# Patient Record
Sex: Female | Born: 2000 | Race: White | Hispanic: Yes | Marital: Single | Smoking: Never smoker
Health system: Southern US, Community
[De-identification: ages and names within clinical notes are randomized; demographics above are authoritative.]

---
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
## 2021-09-13 IMAGING — DX DG KNEE COMPLETE 4+V*L*
4 series · 4 of 4 positions shown · non-contrast
Comparison: None.

CLINICAL DATA: MEDIAL LEFT knee pain after injury.  Rugby injury.

EXAM:
LEFT KNEE - COMPLETE 4+ VIEW

[knee ap]
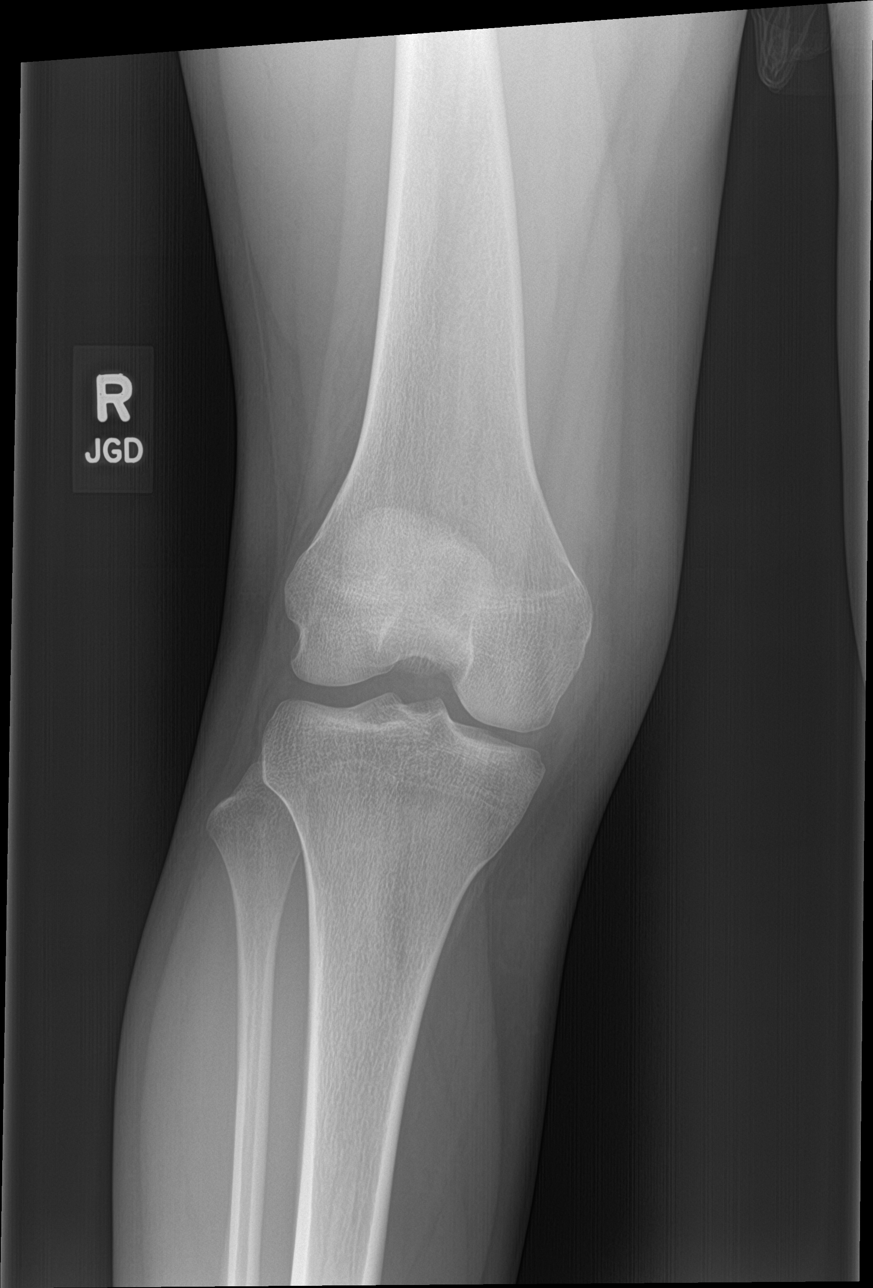

[knee obl]
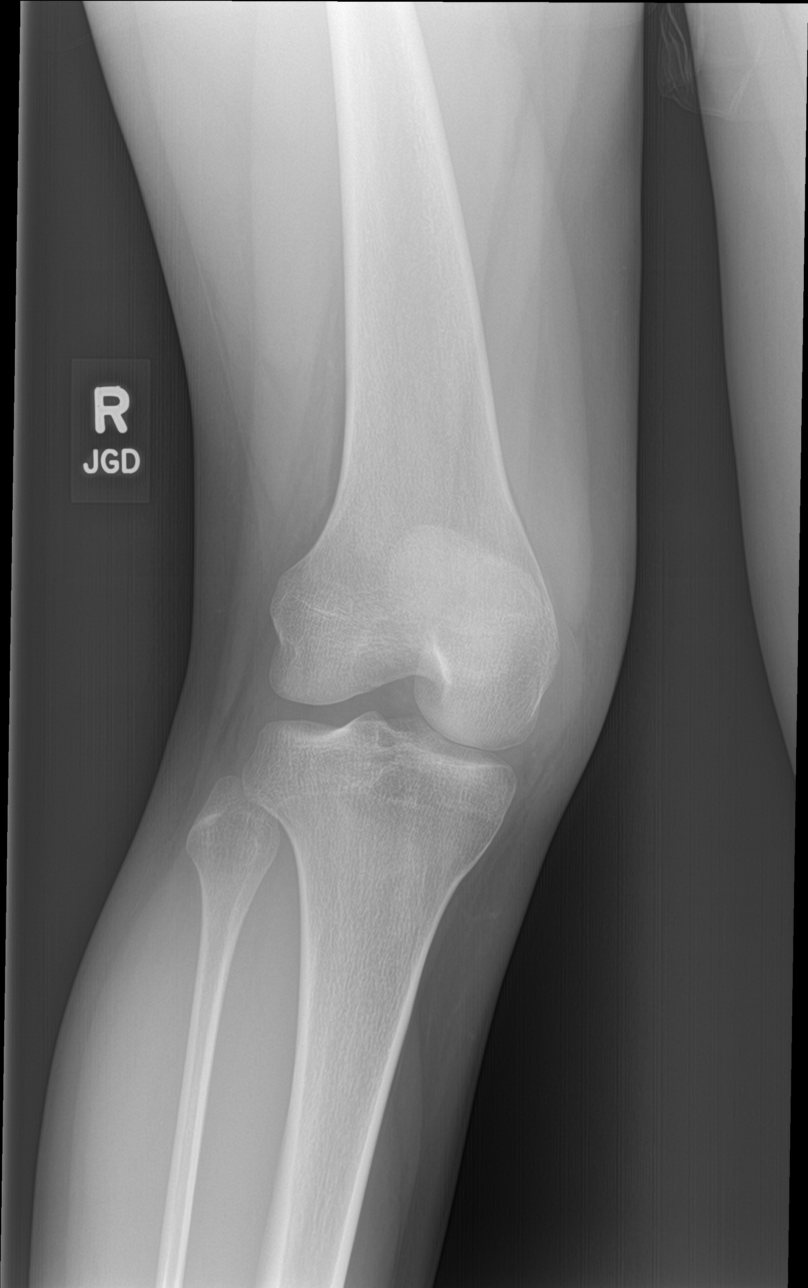

[knee lat]
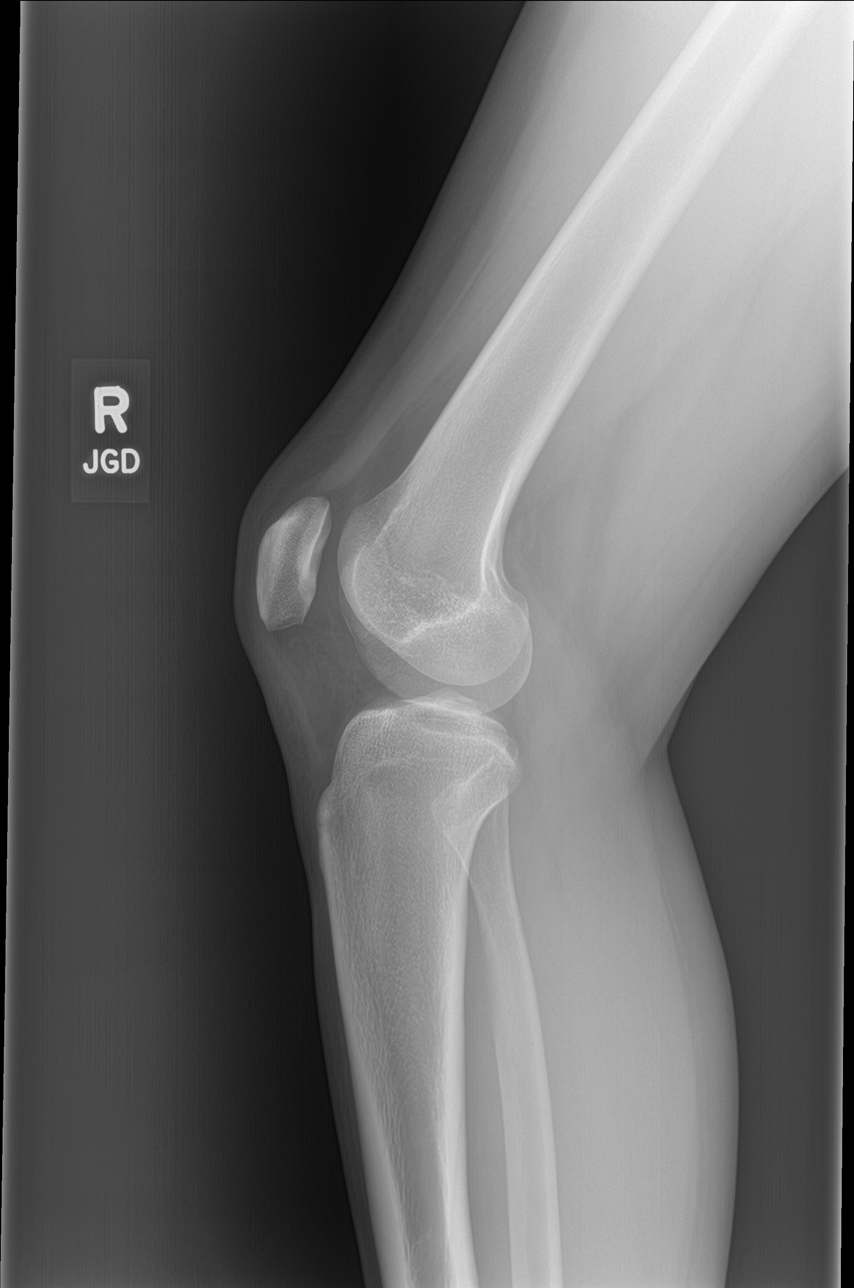

[knee sunrise]
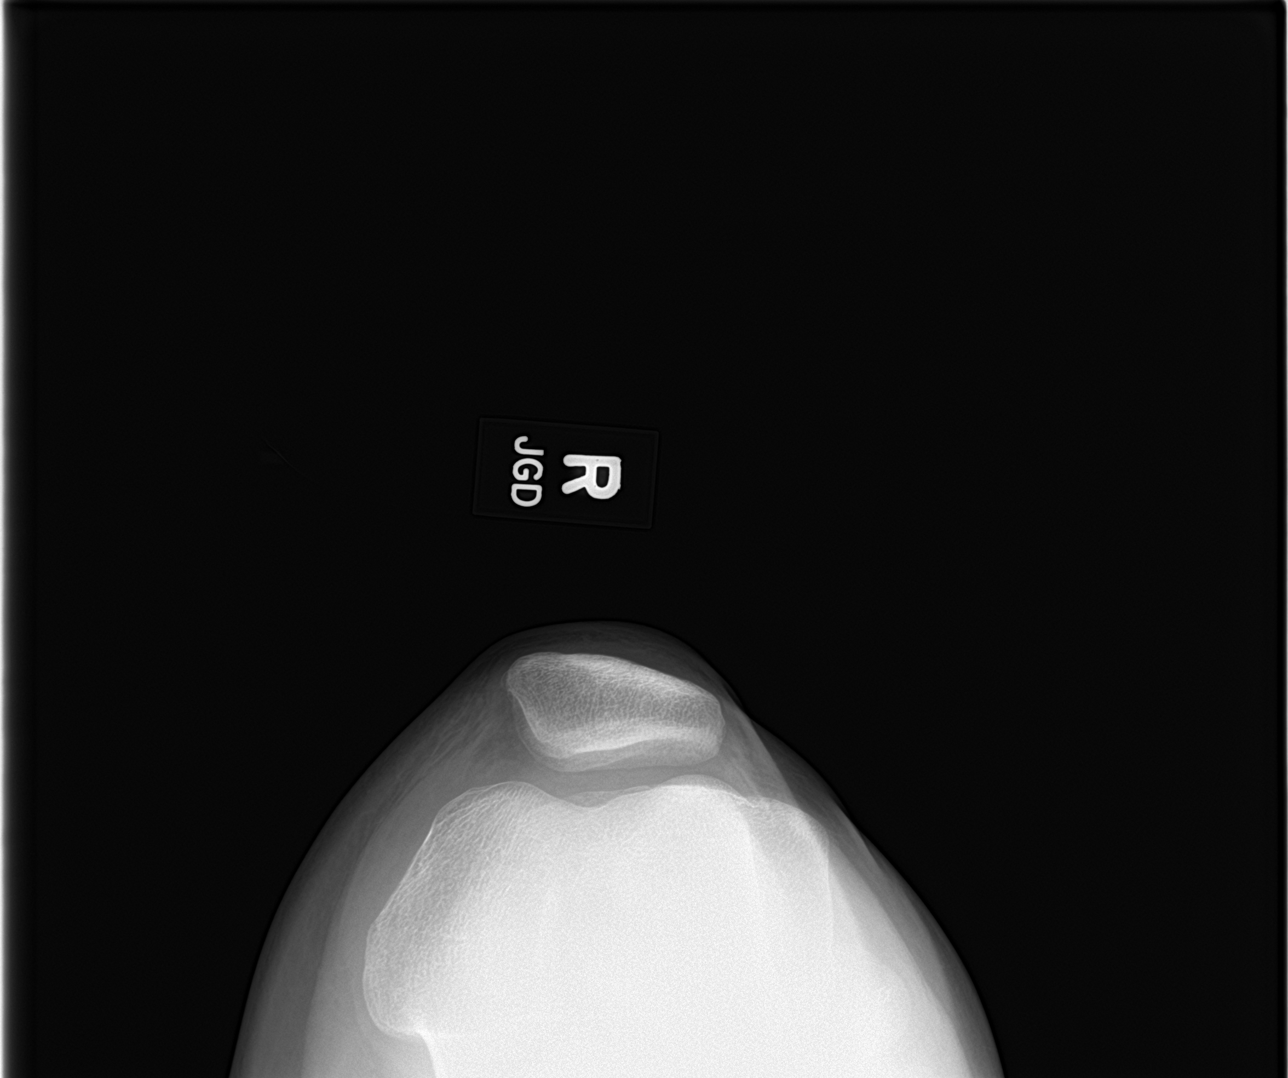

[4 of 4 positions shown; findings below may reference images not displayed]

FINDINGS: No evidence of fracture, dislocation, or joint effusion. No evidence
of arthropathy or other focal bone abnormality. Soft tissues are
unremarkable.
IMPRESSION: Negative.

## 2021-12-01 DIAGNOSIS — Z724 Inappropriate diet and eating habits: Secondary | ICD-10-CM | POA: Diagnosis not present

## 2021-12-06 DIAGNOSIS — F332 Major depressive disorder, recurrent severe without psychotic features: Secondary | ICD-10-CM | POA: Diagnosis not present

## 2021-12-15 DIAGNOSIS — F332 Major depressive disorder, recurrent severe without psychotic features: Secondary | ICD-10-CM | POA: Diagnosis not present

## 2021-12-25 DIAGNOSIS — F339 Major depressive disorder, recurrent, unspecified: Secondary | ICD-10-CM | POA: Diagnosis not present

## 2022-01-01 ENCOUNTER — Encounter: Payer: Self-pay | Admitting: Family Medicine

## 2022-01-01 ENCOUNTER — Ambulatory Visit: Payer: BC Managed Care – PPO | Admitting: Family Medicine

## 2022-01-01 DIAGNOSIS — F339 Major depressive disorder, recurrent, unspecified: Secondary | ICD-10-CM | POA: Diagnosis not present

## 2022-01-01 MED ORDER — FLUOXETINE HCL 20 MG PO TABS: 20.0000 mg | ORAL_TABLET | Freq: Every day | ORAL | 3 refills | Status: DC

## 2022-01-01 NOTE — Patient Instructions (Signed)
Call me in 3-4 weeks.  If you sign up for MyChart, it is easy to send me a MyChart message.  I am starting you on the lower dose of the prozac/fluoxetine.  We can decide in 3-4 weeks if you want to go to the higher dose.   I will have Dr. Hartford Poli call you to set up an appointment.  She can help you find a more long term counselor. You can also look at the psychology today website.   Occasionally, people who start on antidepressants feel worse before they feel better.  Generally, that happens in the first week or two.  If you start feeling worse, see me right away.

## 2022-01-01 NOTE — Assessment & Plan Note (Addendum)
Restart fluoxetine at lower dose (was on 40 mg previously)  FU by me in 3 weeks. She thinks she will benefit from long term counseling.  She will start with dr. Shawnee Knapp and at their discretion, will transition to a more long term counselor.   Duration of visit was 30 minutes.

## 2022-01-01 NOTE — Progress Notes (Signed)
    SUBJECTIVE:   CHIEF COMPLAINT / HPI:   FU depression:  Virginia Perkins was seen regularly by me and Dr. Shawnee Perkins during [employer] senior year of high school for depression.  Per Virginia Perkins, she improved enough to attend [employer] as planned.  I had suggested that she arrange ongoing counseling through the school.  Per Virginia Perkins, that did not happen although she was vague about the reasons.   She muddled through her freshman and sophomore years with difficulty.  "It was hard."  Again, very sparing on the details.  She is not sure she wants to return. PHQ9 scored a 2 on Question 9.  She has no plan.  Has thoughts that she would be better off if not here.  Those thoughts have increased with her worsening symptoms.   It has been quite some time since she has been on prozac.  She does recall the meds helping previously.  She also found counseling helpful.       OBJECTIVE:   BP 103/69   Pulse 78   Ht 5\' 9"  (1.753 m)   Wt 144 lb 3.2 oz (65.4 kg)   SpO2 100%   BMI 21.29 kg/m   Good eye contact throughout   Became quite tearful at one point.  I suspect that I touched a nerve, but I am not sure.  Despite my gentle prodding, she would not elaborate.  Which is fine since she will be getting counseling.  If she will only discuss details with one of , it is more important for her to open up with her counselor.  ASSESSMENT/PLAN:   Major depression, recurrent (HCC) Restart fluoxetine at lower dose (was on 40 mg previously)  FU by me in 3 weeks. She thinks she will benefit from long term counseling.  She will start with dr. [place] and at their discretion, will transition to a more long term counselor.       Virginia Knapp, MD [facility]

## 2022-01-02 ENCOUNTER — Telehealth: Payer: Self-pay | Admitting: Psychology

## 2022-01-02 NOTE — Telephone Encounter (Signed)
Called and lvm about getting therapist set up or seeing me.   Royetta Asal, PhD., LMFT

## 2022-01-18 DIAGNOSIS — R059 Cough, unspecified: Secondary | ICD-10-CM | POA: Diagnosis not present

## 2022-01-18 DIAGNOSIS — Z20822 Contact with and (suspected) exposure to covid-19: Secondary | ICD-10-CM | POA: Diagnosis not present

## 2022-01-18 DIAGNOSIS — R067 Sneezing: Secondary | ICD-10-CM | POA: Diagnosis not present

## 2022-01-18 DIAGNOSIS — R0982 Postnasal drip: Secondary | ICD-10-CM | POA: Diagnosis not present

## 2022-09-18 ENCOUNTER — Encounter: Payer: Self-pay | Admitting: Family Medicine

## 2023-01-29 ENCOUNTER — Ambulatory Visit (INDEPENDENT_AMBULATORY_CARE_PROVIDER_SITE_OTHER): Payer: Self-pay | Admitting: Family Medicine

## 2023-01-29 ENCOUNTER — Encounter: Payer: Self-pay | Admitting: Family Medicine

## 2023-01-29 ENCOUNTER — Other Ambulatory Visit: Payer: Self-pay

## 2023-01-29 VITALS — BP 110/78 | HR 98 | Ht 69.0 in | Wt 161.4 lb

## 2023-01-29 DIAGNOSIS — F339 Major depressive disorder, recurrent, unspecified: Secondary | ICD-10-CM

## 2023-01-29 MED ORDER — FLUOXETINE HCL 20 MG PO TABS: 20.0000 mg | ORAL_TABLET | Freq: Every day | ORAL | 2 refills | Status: AC

## 2023-01-29 NOTE — Patient Instructions (Addendum)
It was nice seeing you today!  I have refilled your Prozac medication.  I am referring you to a social worker to help you get connected with a therapist. You can try calling one of the numbers below if you desire.  Stay well, Virginia Deeds, MD [facility] [phone]  --  Make sure to check out at the front desk before you leave today.  Please arrive at least 15 minutes prior to your scheduled appointments.  If you had blood work today, I will send you a MyChart message or a letter if results are normal. Otherwise, I will give you a call.  If you had a referral placed, they will call you to set up an appointment. Please give [place] a call if you don't hear back in the next 2 weeks.  If you need additional refills before your next appointment, please call your pharmacy first.  --  Therapy and Counseling Resources Most providers on this list will take Medicaid. Patients with commercial insurance or Medicare should contact their insurance company to get a list of in network providers.  [facility] (takes children) Location 1: [address] [place], [place] [address] Location 2: [address] [place], [place] [address] [phone]   [facility] (spanish speaking therapist available)(habla espanol)(take medicare and medicaid)  [address] [place] [address], [place], [place] [address], [email or web address] [phone]  [facility] Counseling [address] [place], [place] [address] [phone]  [facility]   [address] Suite [place], [place] [address]      [phone]  [facility] (spanish available) [address]  [place], [place] [address] [phone]  [facility] (take [facility] and medicare) [address]  [place], [place] [address]       [phone]     [facility] (virtual only) [phone]  [facility]ovita [facility]ussma[facility] [address]  [address], [place], [place] [phone]  [facility]:  [address] [place] [phone]  [facility]:  [address] [place] [place]ien[place] [phone]  [facility] (under & uninsured) [address]   [place] [phone]    [email or web address]    [facility] [address]  [place]    [phone]  [facility] -[address]     Phone:  [phone]     [facility]-  [address]  [phone]   [facility] [address]      [place], [place] [phone]  [facility]: [address] [place], [place], [place]  [phone]   [facility] (Spanish therapist) [email or web address]  [address] [place]  [address]   [place] [address]    [phone]    [facility]   [address],  [place], [place]  [phone]   [facility]  [address] [place]  [phone]  [facility]  [address] [place], [place]        [phone]  Open Access/Walk In Clinic under & uninsured  [facility]  [address] [place], [place] [phone] Crisis [phone]  [facility] [address],  (Spanish)   [address], [place]: [phone]) 8:30 - 12; 1 - 2:30  [facility] [employer],  [address] [place], [place]    (  [phone]):8:30 - 12; 2 - 3PM  [facility] [address]  [address],  [place]; [phone]):   Mon - Fri 8 AM - 5 PM  [facility] [address] [place]  MWF 12:30 to 3:00 or call to schedule an appointment  [phone]  Specific Provider options Psychology Today  [email or web address]/us click on find a therapist  enter your zip code left side and select or tailor a therapist for your specific need.   [facility] Provider Directory [email or web address]  (Medicaid)   Follow all drop down to find a provider  Social Support program [facility] [place] [phone] or PhotoSolver.pl [address], [place], [place] Recovery support and educational   24- Hour Availability:   [facility]  [address] [place], [place] [phone] Crisis [phone]  [facility] the [facility] [phone]  [facility]  [phone]   [facility]  [phone] (after hours)  Therapeutic Alternative/Mobile Crisis   [phone]  [place] National Suicide Hotline  [phone] Len Childs)  Call 911 or go to emergency room  [facility]  [phone]);  [place] and [employer]  [phone]); [place], [place], [place], [place], [place], [place], [place]

## 2023-01-29 NOTE — Progress Notes (Signed)
    SUBJECTIVE:   CHIEF COMPLAINT / HPI:  Chief Complaint  Patient presents with   Mental Health Problem    Interpreter declined.  Patient was last seen for depression over a year ago on 12/2021, at that time was restarted on fluoxetine 20 mg with plan to resume therapy.  Patient reports that she has been struggling with depression for a long time.  She reports feeling more depressed lately feeling more depressed lately, she would like to restart her fluoxetine which she had been on previously and was doing well. Not currently established with a therapist but she is interested, but reports that she has low motivation to get this process started. Reports that she has had some thoughts of being better off dead or not alive, but denies any intent or plan to hurt herself. Support system includes her mother. She was in school at [employer] but has taken the last year off.  She is hopeful to return back to school in the fall. Stressors unclear, she was hesitant to share further details. In her free time she has enjoyed crocheting.  PERTINENT  PMH / PSH: depression  Patient Care Team: McDiarmid, Leighton Roach, MD as PCP - General (Family Medicine)   OBJECTIVE:   BP 110/78   Pulse 98   Ht 5\' 9"  (1.753 m)   Wt 161 lb 6.4 oz (73.2 kg)   SpO2 100%   BMI 23.83 kg/m   Physical Exam Constitutional:      General: She is not in acute distress. Cardiovascular:     Rate and Rhythm: Normal rate and regular rhythm.  Pulmonary:     Effort: Pulmonary effort is normal. No respiratory distress.     Breath sounds: Normal breath sounds.  Musculoskeletal:     Cervical back: Neck supple.  Neurological:     Mental Status: She is alert.  Psychiatric:     Comments: Affect somewhat blunted, patient tearful during encounter         01/29/2023    2:30 PM  Depression screen PHQ 2/9  Decreased Interest 3  Down, Depressed, Hopeless 3  PHQ - 2 Score 6  Altered sleeping 3  Tired, decreased energy 3   Change in appetite 2  Feeling bad or failure about yourself  3  Trouble concentrating 3  Moving slowly or fidgety/restless 0  Suicidal thoughts 1  PHQ-9 Score 21  Difficult doing work/chores Very difficult       {Show previous vital signs (optional):23777}    ASSESSMENT/PLAN:   Problem List Items Addressed This Visit       Other   Major depression, recurrent (HCC) - Primary    Chronic problem, waxing and waning with recent worsening.  She is reluctant to share a lot of information.  She has some passive SI which seems to be chronic, no intent or plan.  I believe she is safe for outpatient management, low immediate risk of self-harm. - therapy resources provided - restarted fluoxetine 20 mg daily - ref social work to help her get established with therapy, she is amenable to receiving assistance - f/u 1 month      Relevant Medications   FLUoxetine (PROZAC) 20 MG tablet   Other Relevant Orders   AMB Referral to Managed Medicaid Care Management       Return in about 4 weeks (around 02/26/2023) for f/u depression.   Littie Deeds, MD [facility]

## 2023-01-29 NOTE — Assessment & Plan Note (Addendum)
Chronic problem, waxing and waning with recent worsening.  She is reluctant to share a lot of information.  She has some passive SI which seems to be chronic, no intent or plan.  I believe she is safe for outpatient management, low immediate risk of self-harm. - therapy resources provided - restarted fluoxetine 20 mg daily - ref social work to help her get established with therapy, she is amenable to receiving assistance - f/u 1 month

## 2023-02-08 ENCOUNTER — Other Ambulatory Visit: Payer: Self-pay | Admitting: Licensed Clinical Social Worker

## 2023-02-08 NOTE — Patient Instructions (Signed)
[name] ,   [facility] Managed Care Team is available to provide assistance to you with your healthcare needs at no cost and as a benefit of your [facility] Health plan. I'm sorry I was unable to reach you today for our scheduled appointment. Our care guide will call you to reschedule our telephone appointment. Please call me at the number below. I am available to be of assistance to you regarding your healthcare needs. .   Thank you,   Dickie La, BSW, MSW, LCSW Managed Medicaid LCSW [facility]  [address] [email or web address] Phone: [phone]

## 2023-02-08 NOTE — Patient Outreach (Signed)
  Medicaid Managed Care   Unsuccessful Attempt Note   02/08/2023 Name: Zoeigh Reaume MRN: [id number] DOB: 28-Feb-2001  Referred by: Caro Laroche, DO Reason for referral : No chief complaint on file.   {MANAGED MEDICAID OUTREACH UNSUCCESSFUL:25040}   Follow Up Plan: {MANAGED MEDICAID FOLLOW UP PLAN:25041}    ***SIGNATURE

## 2023-02-18 ENCOUNTER — Telehealth: Payer: Self-pay

## 2023-02-18 NOTE — Telephone Encounter (Signed)
..   Medicaid Managed Care   Unsuccessful Outreach Note  02/18/2023 Name: Virginia Perkins MRN: [id number] DOB: 2001/02/02  Referred by: Caro Laroche, DO Reason for referral : Appointment (I called to reschedule her missed phone appointment with the MM LCSW. We left a message and we will try again tomorrow with the interpreter.)   A second unsuccessful telephone outreach was attempted today. The patient was referred to the case management team for assistance with care management and care coordination.   Follow Up Plan: A HIPAA compliant phone message was left for the patient providing contact information and requesting a return call.  The care management team will reach out to the patient again over the next 7 days.   Weston Settle Care Guide  [facility] Managed  Care Guide [facility]  [phone]

## 2023-02-20 ENCOUNTER — Telehealth: Payer: Self-pay

## 2023-02-20 NOTE — Telephone Encounter (Signed)
..   Medicaid Managed Care   Unsuccessful Outreach Note  02/20/2023 Name: Virginia Perkins MRN: [id number] DOB: 02-Apr-2001  Referred by: Caro Laroche, DO Reason for referral : Appointment   Third unsuccessful telephone outreach was attempted today. The patient was referred to the case management team for assistance with care management and care coordination. The patient's primary care provider has been notified of our unsuccessful attempts to make or maintain contact with the patient. The care management team is pleased to engage with this patient at any time in the future should he/she be interested in assistance from the care management team.   Follow Up Plan: We have been unable to make contact with the patient for follow up. The care management team is available to follow up with the patient after provider conversation with the patient regarding recommendation for care management engagement and subsequent re-referral to the care management team.   Weston Settle Care Guide  [facility] Managed  Care Guide [facility]  [phone]

## 2023-02-28 ENCOUNTER — Ambulatory Visit: Payer: Self-pay | Admitting: Family Medicine

## 2023-04-22 DIAGNOSIS — M2201 Recurrent dislocation of patella, right knee: Secondary | ICD-10-CM | POA: Diagnosis not present

## 2023-04-22 DIAGNOSIS — M25561 Pain in right knee: Secondary | ICD-10-CM | POA: Diagnosis not present

## 2023-04-24 DIAGNOSIS — F331 Major depressive disorder, recurrent, moderate: Secondary | ICD-10-CM | POA: Diagnosis not present

## 2023-05-02 DIAGNOSIS — F331 Major depressive disorder, recurrent, moderate: Secondary | ICD-10-CM | POA: Diagnosis not present

## 2023-05-07 DIAGNOSIS — F331 Major depressive disorder, recurrent, moderate: Secondary | ICD-10-CM | POA: Diagnosis not present

## 2023-05-07 DIAGNOSIS — F411 Generalized anxiety disorder: Secondary | ICD-10-CM | POA: Diagnosis not present

## 2023-05-08 DIAGNOSIS — M2201 Recurrent dislocation of patella, right knee: Secondary | ICD-10-CM | POA: Diagnosis not present

## 2023-05-08 DIAGNOSIS — M25561 Pain in right knee: Secondary | ICD-10-CM | POA: Diagnosis not present

## 2023-05-08 DIAGNOSIS — F411 Generalized anxiety disorder: Secondary | ICD-10-CM | POA: Diagnosis not present

## 2023-05-08 DIAGNOSIS — F331 Major depressive disorder, recurrent, moderate: Secondary | ICD-10-CM | POA: Diagnosis not present

## 2023-05-10 DIAGNOSIS — F331 Major depressive disorder, recurrent, moderate: Secondary | ICD-10-CM | POA: Diagnosis not present

## 2023-05-10 DIAGNOSIS — F411 Generalized anxiety disorder: Secondary | ICD-10-CM | POA: Diagnosis not present

## 2023-05-14 DIAGNOSIS — F331 Major depressive disorder, recurrent, moderate: Secondary | ICD-10-CM | POA: Diagnosis not present

## 2023-05-14 DIAGNOSIS — F411 Generalized anxiety disorder: Secondary | ICD-10-CM | POA: Diagnosis not present

## 2023-05-16 DIAGNOSIS — F331 Major depressive disorder, recurrent, moderate: Secondary | ICD-10-CM | POA: Diagnosis not present

## 2023-05-16 DIAGNOSIS — F411 Generalized anxiety disorder: Secondary | ICD-10-CM | POA: Diagnosis not present

## 2023-05-23 DIAGNOSIS — M2201 Recurrent dislocation of patella, right knee: Secondary | ICD-10-CM | POA: Diagnosis not present

## 2023-05-23 DIAGNOSIS — M25561 Pain in right knee: Secondary | ICD-10-CM | POA: Diagnosis not present

## 2023-05-24 DIAGNOSIS — F331 Major depressive disorder, recurrent, moderate: Secondary | ICD-10-CM | POA: Diagnosis not present

## 2023-05-24 DIAGNOSIS — F411 Generalized anxiety disorder: Secondary | ICD-10-CM | POA: Diagnosis not present

## 2023-05-28 DIAGNOSIS — F331 Major depressive disorder, recurrent, moderate: Secondary | ICD-10-CM | POA: Diagnosis not present

## 2023-05-29 DIAGNOSIS — F331 Major depressive disorder, recurrent, moderate: Secondary | ICD-10-CM | POA: Diagnosis not present

## 2023-05-29 DIAGNOSIS — F411 Generalized anxiety disorder: Secondary | ICD-10-CM | POA: Diagnosis not present

## 2023-05-30 DIAGNOSIS — F331 Major depressive disorder, recurrent, moderate: Secondary | ICD-10-CM | POA: Diagnosis not present

## 2023-06-03 DIAGNOSIS — F331 Major depressive disorder, recurrent, moderate: Secondary | ICD-10-CM | POA: Diagnosis not present

## 2023-06-05 DIAGNOSIS — F331 Major depressive disorder, recurrent, moderate: Secondary | ICD-10-CM | POA: Diagnosis not present

## 2023-06-06 DIAGNOSIS — F331 Major depressive disorder, recurrent, moderate: Secondary | ICD-10-CM | POA: Diagnosis not present

## 2023-06-10 DIAGNOSIS — F331 Major depressive disorder, recurrent, moderate: Secondary | ICD-10-CM | POA: Diagnosis not present

## 2023-06-12 DIAGNOSIS — F331 Major depressive disorder, recurrent, moderate: Secondary | ICD-10-CM | POA: Diagnosis not present

## 2023-06-13 DIAGNOSIS — F331 Major depressive disorder, recurrent, moderate: Secondary | ICD-10-CM | POA: Diagnosis not present

## 2023-06-17 DIAGNOSIS — F331 Major depressive disorder, recurrent, moderate: Secondary | ICD-10-CM | POA: Diagnosis not present

## 2023-06-17 DIAGNOSIS — F32A Depression, unspecified: Secondary | ICD-10-CM | POA: Diagnosis not present

## 2023-06-17 DIAGNOSIS — F411 Generalized anxiety disorder: Secondary | ICD-10-CM | POA: Diagnosis not present

## 2023-06-18 DIAGNOSIS — F411 Generalized anxiety disorder: Secondary | ICD-10-CM | POA: Diagnosis not present

## 2023-06-18 DIAGNOSIS — F331 Major depressive disorder, recurrent, moderate: Secondary | ICD-10-CM | POA: Diagnosis not present

## 2023-06-19 DIAGNOSIS — F331 Major depressive disorder, recurrent, moderate: Secondary | ICD-10-CM | POA: Diagnosis not present

## 2023-06-20 DIAGNOSIS — F331 Major depressive disorder, recurrent, moderate: Secondary | ICD-10-CM | POA: Diagnosis not present

## 2023-06-26 DIAGNOSIS — F331 Major depressive disorder, recurrent, moderate: Secondary | ICD-10-CM | POA: Diagnosis not present

## 2023-06-26 DIAGNOSIS — F411 Generalized anxiety disorder: Secondary | ICD-10-CM | POA: Diagnosis not present

## 2023-06-27 DIAGNOSIS — F331 Major depressive disorder, recurrent, moderate: Secondary | ICD-10-CM | POA: Diagnosis not present

## 2023-07-15 DIAGNOSIS — F331 Major depressive disorder, recurrent, moderate: Secondary | ICD-10-CM | POA: Diagnosis not present

## 2023-07-15 DIAGNOSIS — F411 Generalized anxiety disorder: Secondary | ICD-10-CM | POA: Diagnosis not present

## 2023-07-16 DIAGNOSIS — F411 Generalized anxiety disorder: Secondary | ICD-10-CM | POA: Diagnosis not present

## 2023-07-16 DIAGNOSIS — F331 Major depressive disorder, recurrent, moderate: Secondary | ICD-10-CM | POA: Diagnosis not present

## 2023-07-24 DIAGNOSIS — F411 Generalized anxiety disorder: Secondary | ICD-10-CM | POA: Diagnosis not present

## 2023-07-24 DIAGNOSIS — F331 Major depressive disorder, recurrent, moderate: Secondary | ICD-10-CM | POA: Diagnosis not present
